# Patient Record
Sex: Male | Born: 1990 | Race: Black or African American | Hispanic: No
Health system: Southern US, Community
[De-identification: ages and names within clinical notes are randomized; demographics above are authoritative.]

---
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
## 2009-03-17 IMAGING — CR DG CHEST 2V
1 series · 2 of 2 positions shown · non-contrast
Comparison: none

REASON FOR EXAM: wheezing, fever, cough, chest pain with cough
COMMENTS:

[Series 1: view not recorded · 0.17mm/px · 2 of 2 slices shown]
[im 1/2]
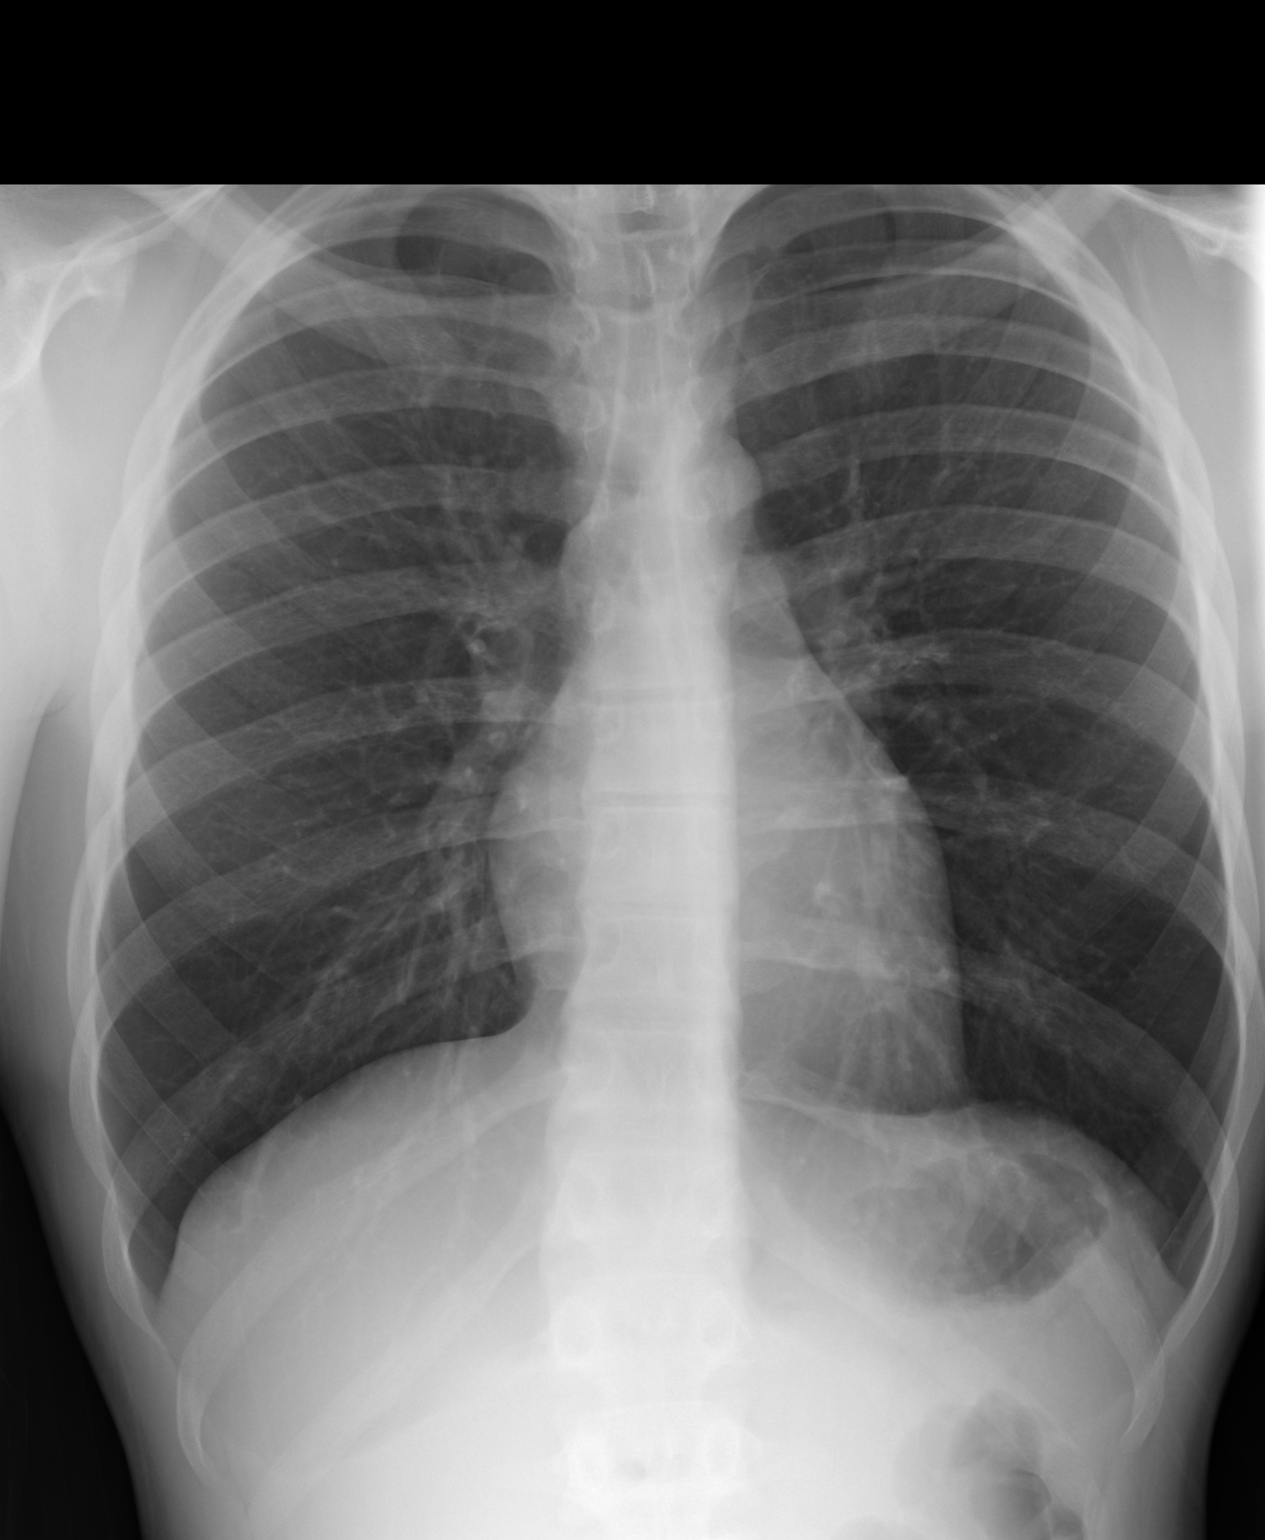
[im 2/2]
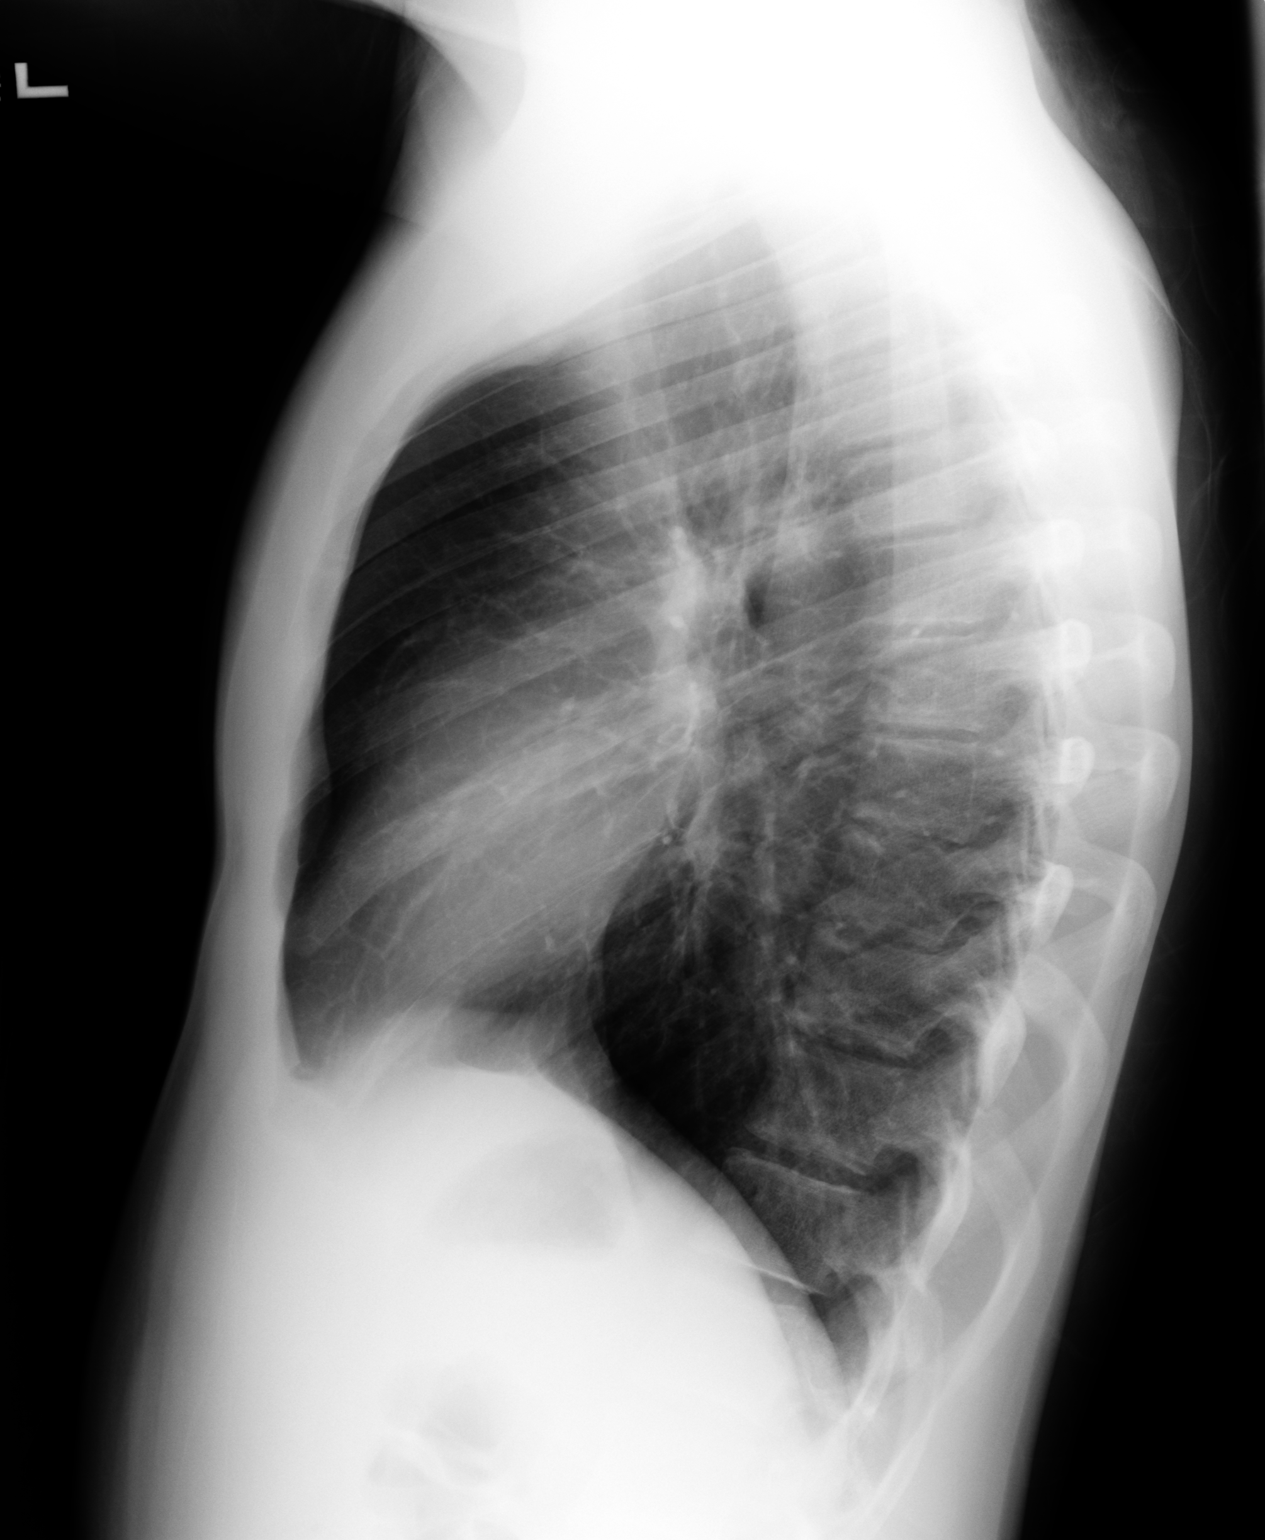

[2 of 2 positions shown; findings below may reference images not displayed]

PROCEDURE:     DXR - DXR CHEST PA (OR AP) AND LATERAL  - June 23, 2007  [DATE]

RESULT:     The lung fields are clear. No pneumonia, pneumothorax or pleural
effusion is seen.  The chest is hyperexpanded compatible with reactive
airway disease. The heart and mediastinal structures are normal in
appearance. No acute bony abnormalities are seen.
IMPRESSION: 1. The lung fields are clear.
2. The chest is hyperexpanded compatible with reactive airway disease.

## 2009-12-12 ENCOUNTER — Emergency Department: Payer: Self-pay | Admitting: Emergency Medicine

## 2010-05-28 ENCOUNTER — Emergency Department: Payer: Self-pay | Admitting: Emergency Medicine

## 2011-12-03 ENCOUNTER — Emergency Department: Payer: Self-pay | Admitting: Emergency Medicine

## 2011-12-17 ENCOUNTER — Emergency Department: Payer: Self-pay | Admitting: Emergency Medicine

## 2012-12-06 ENCOUNTER — Emergency Department: Payer: Self-pay | Admitting: Emergency Medicine

## 2013-01-07 ENCOUNTER — Emergency Department: Payer: Self-pay | Admitting: Emergency Medicine

## 2013-11-16 ENCOUNTER — Emergency Department: Payer: Self-pay | Admitting: Emergency Medicine

## 2014-08-31 IMAGING — CR DG CHEST 2V
1 series · 3 of 3 positions shown · non-contrast
Comparison: none

REASON FOR EXAM: sob
COMMENTS:   May transport without cardiac monitor

PROCEDURE:     DXR - DXR CHEST PA (OR AP) AND LATERAL  - December 06, 2012  [DATE]
RESULT:     The lungs are clear. The cardiac silhouette and visualized bony
skeleton are unremarkable.

[Series 1: w chest pa · 0.14mm/px · 3 of 3 slices shown]
[im 1/3]
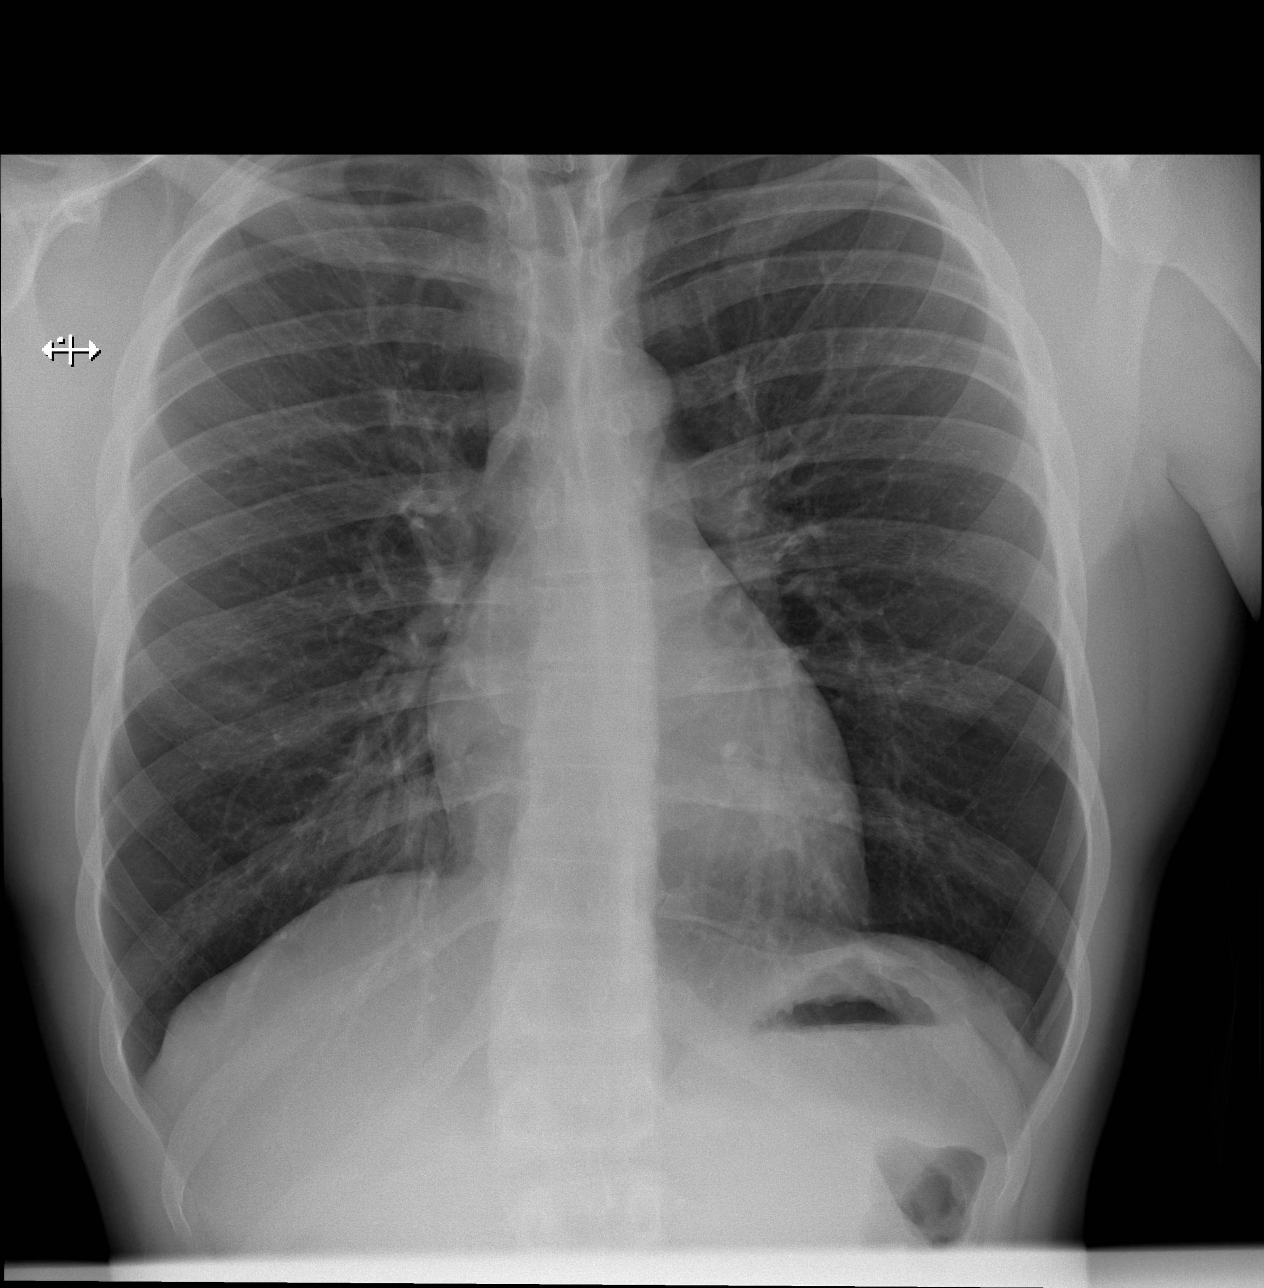
[im 2/3]
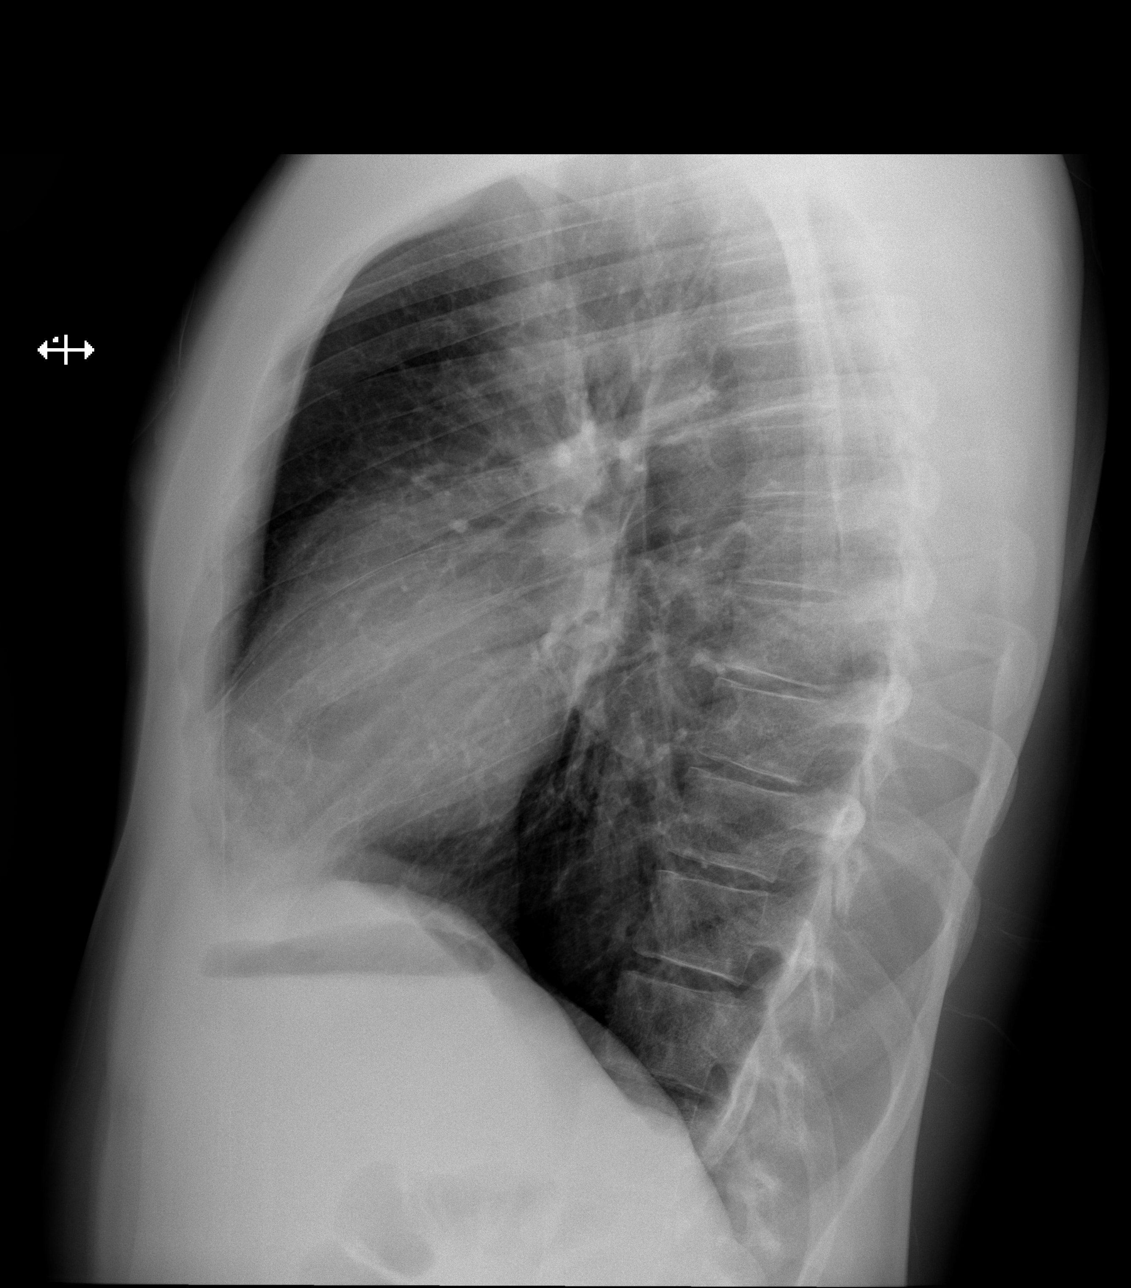
[im 3/3]
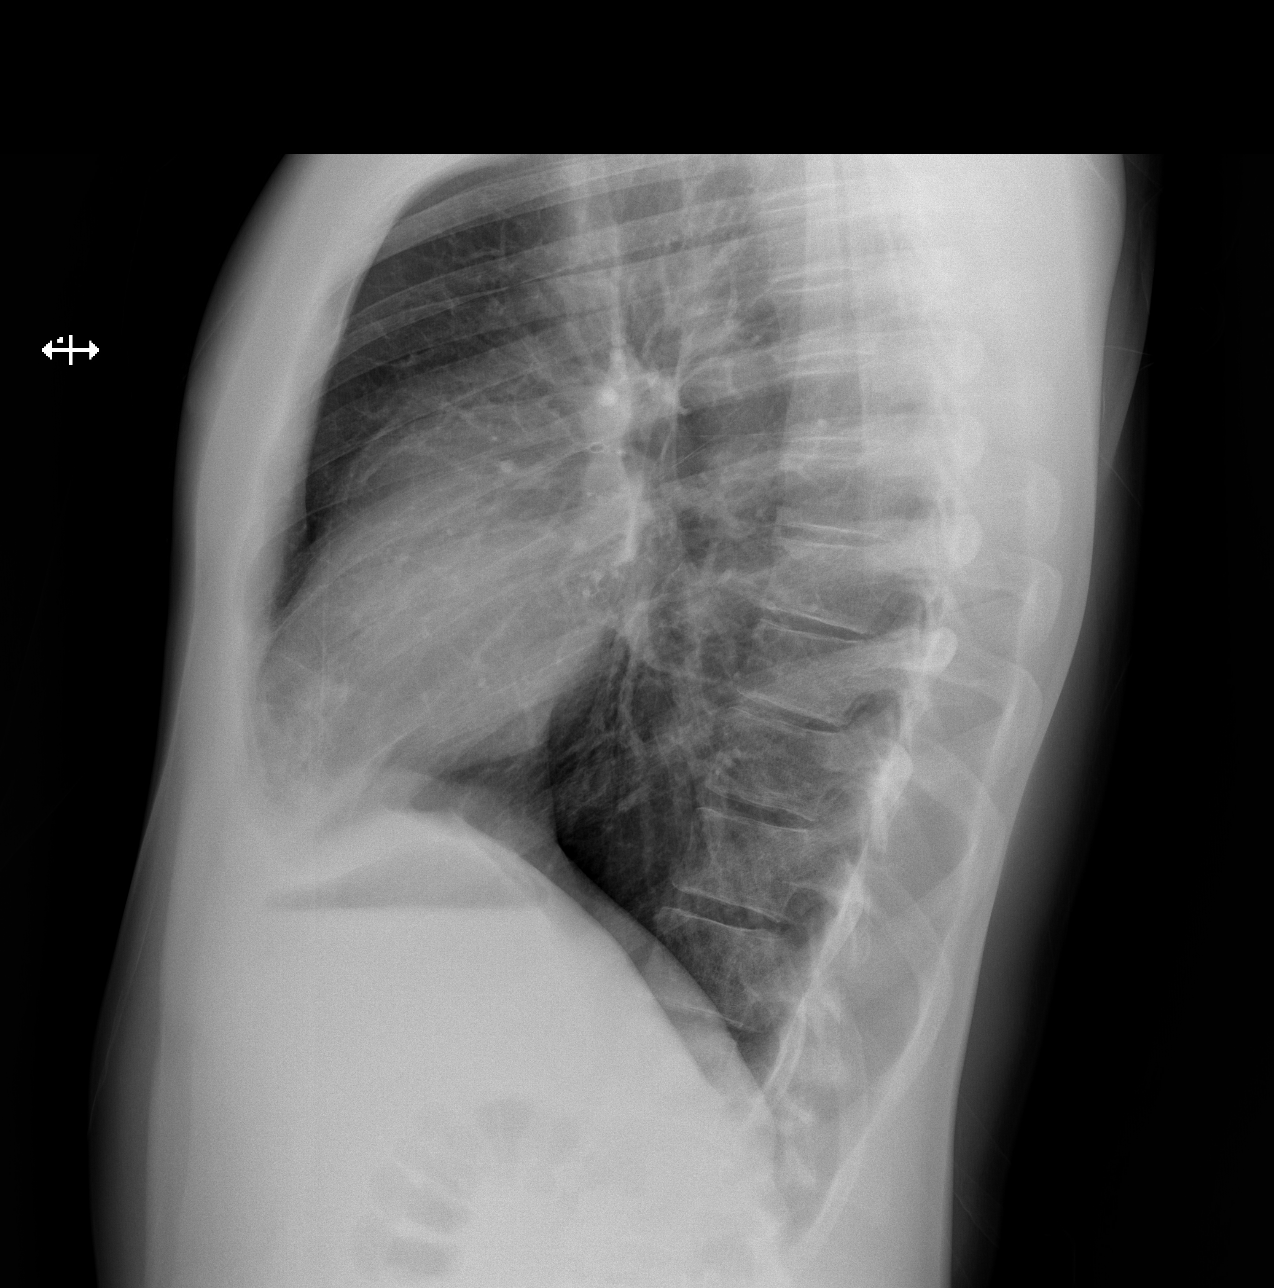

[3 of 3 positions shown; findings below may reference images not displayed]

IMPRESSION: 1. Chest radiograph without evidence of acute cardiopulmonary disease.

## 2019-10-22 ENCOUNTER — Ambulatory Visit: Payer: Self-pay | Attending: Internal Medicine

## 2019-10-22 DIAGNOSIS — Z20822 Contact with and (suspected) exposure to covid-19: Secondary | ICD-10-CM

## 2019-10-23 LAB — NOVEL CORONAVIRUS, NAA: SARS-CoV-2, NAA: DETECTED — AB

## 2019-10-23 LAB — SARS-COV-2, NAA 2 DAY TAT

## 2020-05-18 ENCOUNTER — Other Ambulatory Visit: Payer: Self-pay

## 2020-05-18 ENCOUNTER — Emergency Department
Admission: EM | Admit: 2020-05-18 | Discharge: 2020-05-18 | Disposition: A | Discharge status: Home / Self Care | Payer: Self-pay | Attending: Emergency Medicine | Admitting: Emergency Medicine

## 2020-05-18 ENCOUNTER — Encounter: Payer: Self-pay | Admitting: Emergency Medicine

## 2020-05-18 DIAGNOSIS — X500XXA Overexertion from strenuous movement or load, initial encounter: Secondary | ICD-10-CM | POA: Insufficient documentation

## 2020-05-18 DIAGNOSIS — S39012A Strain of muscle, fascia and tendon of lower back, initial encounter: Secondary | ICD-10-CM | POA: Insufficient documentation

## 2020-05-18 MED ORDER — KETOROLAC TROMETHAMINE 60 MG/2ML IM SOLN
60.0000 mg | Freq: Once | INTRAMUSCULAR | Status: AC
  Administered 2020-05-18: 60 mg via INTRAMUSCULAR
  Filled 2020-05-18: qty 2

## 2020-05-18 MED ORDER — ORPHENADRINE CITRATE 30 MG/ML IJ SOLN
60.0000 mg | Freq: Two times a day (BID) | INTRAMUSCULAR | Status: DC
  Administered 2020-05-18: 60 mg via INTRAMUSCULAR
  Filled 2020-05-18: qty 2

## 2020-05-18 MED ORDER — LIDOCAINE 5 % EX PTCH
2.0000 | MEDICATED_PATCH | CUTANEOUS | Status: DC
  Administered 2020-05-18: 2 via TRANSDERMAL
  Filled 2020-05-18: qty 2

## 2020-05-18 MED ORDER — LIDOCAINE 5 % EX PTCH: 2.0000 | MEDICATED_PATCH | Freq: Two times a day (BID) | CUTANEOUS | 0 refills | Status: AC

## 2020-05-18 MED ORDER — ORPHENADRINE CITRATE ER 100 MG PO TB12: 100.0000 mg | ORAL_TABLET | Freq: Two times a day (BID) | ORAL | 0 refills | Status: AC

## 2020-05-18 MED ORDER — NAPROXEN 500 MG PO TABS: 500.0000 mg | ORAL_TABLET | Freq: Two times a day (BID) | ORAL | Status: AC

## 2020-05-18 NOTE — ED Provider Notes (Signed)
[facility] Emergency Department Provider Note   ____________________________________________   First MD Initiated Contact with Patient 05/18/20 1458     (approximate)  I have reviewed the triage vital signs and the nursing notes.   HISTORY  Chief Complaint Back Pain    HPI JACOBB Meadows is a 29 y.o. male patient complain bilateral low back pain for 3 weeks.  Patient states incident occurred when he moves too fast and strained his back.  Patient states pain is intermitting with no radicular component.  Denies bladder or bowel dysfunction.  Rates the pain as a 10/10 especially with flexion and lateral movements.  Describes the pain as "spasmatic/achy".  Mild relief with anti-inflammatory medications.  No relief with hot or cold applications.      History reviewed. No pertinent past medical history.  There are no problems to display for this patient.   History reviewed. No pertinent surgical history.  Prior to Admission medications   Medication Sig Start Date End Date Taking? Authorizing Provider  lidocaine (LIDODERM) 5 % Place 2 patches onto the skin every 12 (twelve) hours. Remove & Discard patch within 12 hours or as directed by MD 05/18/20 05/18/21  Joni Reining, PA-C  naproxen (NAPROSYN) 500 MG tablet Take 1 tablet (500 mg total) by mouth 2 (two) times daily with a meal. 05/18/20   Joni Reining, PA-C  orphenadrine (NORFLEX) 100 MG tablet Take 1 tablet (100 mg total) by mouth 2 (two) times daily. 05/18/20   Joni Reining, PA-C    Allergies Patient has no allergy information on record.  No family history on file.  Social History Social History   Tobacco Use  . Smoking status: Not on file  Substance Use Topics  . Alcohol use: Not on file  . Drug use: Not on file    Review of Systems Constitutional: No fever/chills Eyes: No visual changes. ENT: No sore throat. Cardiovascular: Denies chest pain. Respiratory: Denies  shortness of breath. Gastrointestinal: No abdominal pain.  No nausea, no vomiting.  No diarrhea.  No constipation. Genitourinary: Negative for dysuria. Musculoskeletal: Positive for back pain. Skin: Negative for rash. Neurological: Negative for headaches, focal weakness or numbness.   ____________________________________________   PHYSICAL EXAM:  VITAL SIGNS: ED Triage Vitals [05/18/20 1323]  Enc Vitals Group     BP      Pulse      Resp      Temp      Temp src      SpO2      Weight 230 lb (104.3 kg)     Height 6\' 5"  (1.956 m)     Head Circumference      Peak Flow      Pain Score 10     Pain Loc      Pain Edu?      Excl. in GC?     Constitutional: Alert and oriented. Well appearing and in no acute distress. Cardiovascular: Normal rate, regular rhythm. Grossly normal heart sounds.  Good peripheral circulation. Respiratory: Normal respiratory effort.  No retractions. Lungs CTAB. Gastrointestinal: Soft and nontender. No distention. No abdominal bruits. No CVA tenderness. Genitourinary: Deferred Musculoskeletal: No obvious spinal deformity.  No guarding palpation spinal processes.  Patient is moderate guarding palpation bilateral paraspinal muscle area.  Palpable muscle spasm with lateral movements.  Patient demonstrates negative straight leg test in the sitting position. Neurologic:  Normal speech and language. No gross focal neurologic deficits are appreciated. No gait instability.  Skin:  Skin is warm, dry and intact. No rash noted. Psychiatric: Mood and affect are normal. Speech and behavior are normal.  ____________________________________________   LABS (all labs ordered are listed, but only abnormal results are displayed)  Labs Reviewed - No data to display ____________________________________________  EKG   ____________________________________________  RADIOLOGY I, Joni Reining, personally viewed and evaluated these images (plain radiographs) as part of my  medical decision making, as well as reviewing the written report by the radiologist.  ED MD interpretation:    Official radiology report(s): No results found.  ____________________________________________   PROCEDURES  Procedure(s) performed (including Critical Care):  Procedures   ____________________________________________   INITIAL IMPRESSION / ASSESSMENT AND PLAN / ED COURSE  As part of my medical decision making, I reviewed the following data within the electronic MEDICAL RECORD NUMBER     Patient presents with 3 weeks of bilateral back pain.  Patient complaint physical exam is consistent with lumbar strain.  Patient given discharge care instructions.  Patient given a prescription for Norflex, Lidoderm patches, and naproxen.  Patient advised [facility].  Return to ED if condition worsens.          ____________________________________________   FINAL CLINICAL IMPRESSION(S) / ED DIAGNOSES  Final diagnoses:  Strain of lumbar region, initial encounter     ED Discharge Orders         Ordered    orphenadrine (NORFLEX) 100 MG tablet  2 times daily        05/18/20 1513    naproxen (NAPROSYN) 500 MG tablet  2 times daily with meals        05/18/20 1513    lidocaine (LIDODERM) 5 %  Every 12 hours        05/18/20 1513          *Please note:  Daniel Meadows was evaluated in Emergency Department on 05/18/2020 for the symptoms described in the history of present illness. He was evaluated in the context of the global COVID-19 pandemic, which necessitated consideration that the patient might be at risk for infection with the SARS-CoV-2 virus that causes COVID-19. Institutional protocols and algorithms that pertain to the evaluation of patients at risk for COVID-19 are in a state of rapid change based on information released by regulatory bodies including the CDC and federal and state organizations. These policies and algorithms were followed  during the patient's care in the ED.  Some ED evaluations and interventions may be delayed as a result of limited staffing during and the pandemic.*   Note:  This document was prepared using Dragon voice recognition software and may include unintentional dictation errors.    Joni Reining, PA-C 05/18/20 1519    Merwyn Katos, MD 05/18/20 [id number]

## 2020-05-18 NOTE — ED Triage Notes (Signed)
Pt reports 3 weeks ago moved to fast and strained his back and has had pain there since

## 2020-05-18 NOTE — Discharge Instructions (Addendum)
Do not use heat or cold packs at this time.  Take medication as directed.

## 2020-05-31 ENCOUNTER — Ambulatory Visit: Payer: Self-pay | Admitting: Physician Assistant

## 2020-05-31 ENCOUNTER — Encounter: Payer: Self-pay | Admitting: Physician Assistant

## 2020-05-31 ENCOUNTER — Other Ambulatory Visit: Payer: Self-pay

## 2020-05-31 DIAGNOSIS — Z113 Encounter for screening for infections with a predominantly sexual mode of transmission: Secondary | ICD-10-CM

## 2020-05-31 LAB — GRAM STAIN

## 2020-05-31 NOTE — Progress Notes (Signed)
[facility] Department STI clinic/screening visit  Subjective:  Daniel Meadows is a 29 y.o. male being seen today for an STI screening visit. The patient reports they do have symptoms.    Patient has the following medical conditions:  There are no problems to display for this patient.    Chief Complaint  Patient presents with  . SEXUALLY TRANSMITTED DISEASE    screening    HPI  Patient reports that sometimes his semen has an "odor".  Reports that his partner has recently been diagnosed with a bacteria infection but he is not sure if it was BV or a specific bacteria.  Denies chronic conditions, surgeries and regular medicines.  States that he recently injured his back and since then has had an occasional "sharp pain" in his groin.  States his last HIV test was in 2016 and last void prior to sample collection for Gram stain was about 2 hr ago.   See flowsheet for further details and programmatic requirements.    The following portions of the patient's history were reviewed and updated as appropriate: allergies, current medications, past medical history, past social history, past surgical history and problem list.  Objective:  There were no vitals filed for this visit.  Physical Exam Constitutional:      General: He is not in acute distress.    Appearance: Normal appearance.  HENT:     Head: Normocephalic and atraumatic.     Comments: No nits,lice, or hair loss. No cervical, supraclavicular or axillary adenopathy.    Mouth/Throat:     Mouth: Mucous membranes are moist.     Pharynx: Oropharynx is clear. No oropharyngeal exudate or posterior oropharyngeal erythema.  Eyes:     Conjunctiva/sclera: Conjunctivae normal.  Pulmonary:     Effort: Pulmonary effort is normal.  Abdominal:     Palpations: Abdomen is soft. There is no mass.     Tenderness: There is no abdominal tenderness. There is no guarding or rebound.  Genitourinary:    Penis: Normal.       Testes: Normal.     Comments: Pubic area without nits, lice, hair loss, edema, erythema, lesions and inguinal adenopathy. Penis uncircumcised without rash, lesions and discharge at meatus. Musculoskeletal:     Cervical back: Neck supple. No tenderness.  Skin:    General: Skin is warm and dry.     Findings: No bruising, erythema, lesion or rash.  Neurological:     Mental Status: He is alert and oriented to person, place, and time.  Psychiatric:        Mood and Affect: Mood normal.        Behavior: Behavior normal.        Thought Content: Thought content normal.        Judgment: Judgment normal.       Assessment and Plan:  Daniel Meadows is a 29 y.o. male presenting to the [facility] Department for STI screening  1. Screening for STD (sexually transmitted disease) Patient into clinic with symptoms. Patient declines blood work today. Counseled patient re:  Gram stain results and no treatment indicated today. Enc patient to check with his partner re: specific dx and call clinic for a treatment appointment if the partner was positive for GC, Chlamydia, Trich or Syphilis. Rec condoms with all sex. Await test results.  Counseled that RN will call if needs to RTC for treatment once results are back. - Gram stain - Gonococcus culture  No follow-ups on file.  No future appointments.  Jerene Dilling, PA

## 2020-06-05 LAB — GONOCOCCUS CULTURE

## 2022-09-06 ENCOUNTER — Ambulatory Visit: Payer: Self-pay

## 2022-11-08 ENCOUNTER — Ambulatory Visit: Payer: Self-pay

## 2022-11-14 ENCOUNTER — Ambulatory Visit: Payer: Self-pay

## 2023-04-19 ENCOUNTER — Ambulatory Visit: Payer: Self-pay
